# Patient Record
Sex: Female | Born: 1940 | Race: White | Hispanic: No | Marital: Married | Smoking: Never smoker
Health system: Southern US, Community
[De-identification: ages and names within clinical notes are randomized; demographics above are authoritative.]

## PROBLEM LIST (undated history)

## (undated) DIAGNOSIS — R011 Cardiac murmur, unspecified: Secondary | ICD-10-CM

## (undated) DIAGNOSIS — K219 Gastro-esophageal reflux disease without esophagitis: Secondary | ICD-10-CM

## (undated) DIAGNOSIS — G2581 Restless legs syndrome: Secondary | ICD-10-CM

## (undated) DIAGNOSIS — Z974 Presence of external hearing-aid: Secondary | ICD-10-CM

## (undated) DIAGNOSIS — F419 Anxiety disorder, unspecified: Secondary | ICD-10-CM

## (undated) DIAGNOSIS — I10 Essential (primary) hypertension: Secondary | ICD-10-CM

## (undated) DIAGNOSIS — E785 Hyperlipidemia, unspecified: Secondary | ICD-10-CM

## (undated) DIAGNOSIS — M1990 Unspecified osteoarthritis, unspecified site: Secondary | ICD-10-CM

## (undated) HISTORY — PX: ORIF ANKLE FRACTURE: SUR919

## (undated) HISTORY — PX: OOPHORECTOMY: SHX86

## (undated) HISTORY — PX: APPENDECTOMY: SHX54

## (undated) HISTORY — PX: ABDOMINAL HYSTERECTOMY: SHX81

---

## 2006-01-26 ENCOUNTER — Ambulatory Visit: Payer: Self-pay | Admitting: Unknown Physician Specialty

## 2006-04-09 ENCOUNTER — Ambulatory Visit: Payer: Self-pay | Admitting: Gastroenterology

## 2006-06-12 ENCOUNTER — Ambulatory Visit: Payer: Self-pay | Admitting: Otolaryngology

## 2010-03-17 HISTORY — PX: KNEE ARTHROSCOPY: SUR90

## 2011-02-03 ENCOUNTER — Ambulatory Visit: Payer: Self-pay | Admitting: Physician Assistant

## 2011-02-13 ENCOUNTER — Ambulatory Visit: Payer: Self-pay | Admitting: Orthopedic Surgery

## 2011-02-13 DIAGNOSIS — Z01810 Encounter for preprocedural cardiovascular examination: Secondary | ICD-10-CM

## 2011-02-18 ENCOUNTER — Ambulatory Visit: Payer: Self-pay | Admitting: Orthopedic Surgery

## 2011-11-03 ENCOUNTER — Ambulatory Visit: Payer: Self-pay | Admitting: Internal Medicine

## 2013-03-29 ENCOUNTER — Ambulatory Visit: Payer: Self-pay | Admitting: Internal Medicine

## 2014-06-02 ENCOUNTER — Ambulatory Visit: Payer: Self-pay | Admitting: Internal Medicine

## 2015-10-08 ENCOUNTER — Other Ambulatory Visit: Payer: Self-pay | Admitting: Internal Medicine

## 2015-10-08 DIAGNOSIS — Z1231 Encounter for screening mammogram for malignant neoplasm of breast: Secondary | ICD-10-CM

## 2015-10-23 ENCOUNTER — Other Ambulatory Visit: Payer: Self-pay | Admitting: Internal Medicine

## 2015-10-23 ENCOUNTER — Ambulatory Visit
Admission: RE | Admit: 2015-10-23 | Discharge: 2015-10-23 | Disposition: A | Discharge status: Home / Self Care | Payer: Medicare PPO | Source: Ambulatory Visit | Attending: Internal Medicine | Admitting: Internal Medicine

## 2015-10-23 DIAGNOSIS — Z1231 Encounter for screening mammogram for malignant neoplasm of breast: Secondary | ICD-10-CM | POA: Insufficient documentation

## 2016-12-01 ENCOUNTER — Other Ambulatory Visit: Payer: Self-pay | Admitting: Internal Medicine

## 2016-12-01 DIAGNOSIS — Z1231 Encounter for screening mammogram for malignant neoplasm of breast: Secondary | ICD-10-CM

## 2016-12-09 ENCOUNTER — Ambulatory Visit
Admission: RE | Admit: 2016-12-09 | Discharge: 2016-12-09 | Disposition: A | Discharge status: Home / Self Care | Payer: Medicare PPO | Source: Ambulatory Visit | Attending: Internal Medicine | Admitting: Internal Medicine

## 2016-12-09 DIAGNOSIS — Z1231 Encounter for screening mammogram for malignant neoplasm of breast: Secondary | ICD-10-CM | POA: Diagnosis present

## 2018-01-04 ENCOUNTER — Other Ambulatory Visit: Payer: Self-pay | Admitting: Internal Medicine

## 2018-01-04 DIAGNOSIS — Z1231 Encounter for screening mammogram for malignant neoplasm of breast: Secondary | ICD-10-CM

## 2018-01-28 ENCOUNTER — Ambulatory Visit
Admission: RE | Admit: 2018-01-28 | Discharge: 2018-01-28 | Disposition: A | Discharge status: Home / Self Care | Payer: Medicare PPO | Source: Ambulatory Visit | Attending: Internal Medicine | Admitting: Internal Medicine

## 2018-01-28 DIAGNOSIS — Z1231 Encounter for screening mammogram for malignant neoplasm of breast: Secondary | ICD-10-CM | POA: Diagnosis present

## 2019-08-26 ENCOUNTER — Other Ambulatory Visit: Payer: Self-pay | Admitting: Internal Medicine

## 2019-08-26 DIAGNOSIS — Z1231 Encounter for screening mammogram for malignant neoplasm of breast: Secondary | ICD-10-CM

## 2019-09-08 ENCOUNTER — Ambulatory Visit
Admission: RE | Admit: 2019-09-08 | Discharge: 2019-09-08 | Disposition: A | Discharge status: Home / Self Care | Payer: Medicare PPO | Source: Ambulatory Visit | Attending: Internal Medicine | Admitting: Internal Medicine

## 2019-09-08 DIAGNOSIS — Z1231 Encounter for screening mammogram for malignant neoplasm of breast: Secondary | ICD-10-CM | POA: Diagnosis not present

## 2019-12-21 ENCOUNTER — Other Ambulatory Visit: Payer: Self-pay

## 2019-12-21 ENCOUNTER — Encounter: Payer: Self-pay | Admitting: Ophthalmology

## 2019-12-26 ENCOUNTER — Other Ambulatory Visit: Payer: Self-pay

## 2019-12-26 ENCOUNTER — Other Ambulatory Visit
Admission: RE | Admit: 2019-12-26 | Discharge: 2019-12-26 | Disposition: A | Discharge status: Home / Self Care | Payer: Medicare PPO | Source: Ambulatory Visit | Attending: Ophthalmology | Admitting: Ophthalmology

## 2019-12-26 DIAGNOSIS — Z20822 Contact with and (suspected) exposure to covid-19: Secondary | ICD-10-CM | POA: Diagnosis not present

## 2019-12-26 DIAGNOSIS — Z01818 Encounter for other preprocedural examination: Secondary | ICD-10-CM | POA: Diagnosis present

## 2019-12-26 LAB — SARS CORONAVIRUS 2 (TAT 6-24 HRS): SARS Coronavirus 2: NEGATIVE

## 2019-12-26 NOTE — Discharge Instructions (Signed)

## 2019-12-27 NOTE — Anesthesia Preprocedure Evaluation (Addendum)
Anesthesia Evaluation  Patient identified by MRN, date of birth, ID band Patient awake    Reviewed: Allergy & Precautions, NPO status , Patient's Chart, lab work & pertinent test results, reviewed documented beta blocker date and time   History of Anesthesia Complications Negative for: history of anesthetic complications  Airway Mallampati: III  TM Distance: >3 FB Neck ROM: Limited    Dental   Pulmonary    breath sounds clear to auscultation       Cardiovascular Exercise Tolerance: Poor hypertension, (-) angina+ DOE (Chronic, stable)   Rhythm:Regular Rate:Normal   HLD   Neuro/Psych PSYCHIATRIC DISORDERS Anxiety Depression  Restless leg syndrome    GI/Hepatic GERD  Controlled,  Endo/Other    Renal/GU      Musculoskeletal  (+) Arthritis ,   Abdominal   Peds  Hematology   Anesthesia Other Findings   Reproductive/Obstetrics                            Anesthesia Physical Anesthesia Plan  ASA: III  Anesthesia Plan: MAC   Post-op Pain Management:    Induction: Intravenous  PONV Risk Score and Plan: 2 and TIVA, Midazolam and Treatment may vary due to age or medical condition  Airway Management Planned: Nasal Cannula  Additional Equipment:   Intra-op Plan:   Post-operative Plan:   Informed Consent: I have reviewed the patients History and Physical, chart, labs and discussed the procedure including the risks, benefits and alternatives for the proposed anesthesia with the patient or authorized representative who has indicated his/her understanding and acceptance.       Plan Discussed with: CRNA and Anesthesiologist  Anesthesia Plan Comments:        Anesthesia Quick Evaluation

## 2019-12-28 ENCOUNTER — Ambulatory Visit: Payer: Medicare PPO | Admitting: Anesthesiology

## 2019-12-28 ENCOUNTER — Encounter: Payer: Self-pay | Admitting: Ophthalmology

## 2019-12-28 ENCOUNTER — Encounter
Admission: RE | Disposition: A | Discharge status: Home / Self Care | Payer: Self-pay | Source: Home / Self Care | Attending: Ophthalmology

## 2019-12-28 ENCOUNTER — Ambulatory Visit
Admission: RE | Admit: 2019-12-28 | Discharge: 2019-12-28 | Disposition: A | Discharge status: Home / Self Care | Payer: Medicare PPO | Attending: Ophthalmology | Admitting: Ophthalmology

## 2019-12-28 ENCOUNTER — Other Ambulatory Visit: Payer: Self-pay

## 2019-12-28 DIAGNOSIS — K219 Gastro-esophageal reflux disease without esophagitis: Secondary | ICD-10-CM | POA: Diagnosis not present

## 2019-12-28 DIAGNOSIS — F32A Depression, unspecified: Secondary | ICD-10-CM | POA: Insufficient documentation

## 2019-12-28 DIAGNOSIS — H5703 Miosis: Secondary | ICD-10-CM | POA: Insufficient documentation

## 2019-12-28 DIAGNOSIS — E7800 Pure hypercholesterolemia, unspecified: Secondary | ICD-10-CM | POA: Insufficient documentation

## 2019-12-28 DIAGNOSIS — M810 Age-related osteoporosis without current pathological fracture: Secondary | ICD-10-CM | POA: Insufficient documentation

## 2019-12-28 DIAGNOSIS — H2512 Age-related nuclear cataract, left eye: Secondary | ICD-10-CM | POA: Insufficient documentation

## 2019-12-28 DIAGNOSIS — I10 Essential (primary) hypertension: Secondary | ICD-10-CM | POA: Insufficient documentation

## 2019-12-28 DIAGNOSIS — Z79899 Other long term (current) drug therapy: Secondary | ICD-10-CM | POA: Diagnosis not present

## 2019-12-28 DIAGNOSIS — E785 Hyperlipidemia, unspecified: Secondary | ICD-10-CM | POA: Diagnosis not present

## 2019-12-28 DIAGNOSIS — H9190 Unspecified hearing loss, unspecified ear: Secondary | ICD-10-CM | POA: Insufficient documentation

## 2019-12-28 DIAGNOSIS — M1990 Unspecified osteoarthritis, unspecified site: Secondary | ICD-10-CM | POA: Insufficient documentation

## 2019-12-28 HISTORY — DX: Restless legs syndrome: G25.81

## 2019-12-28 HISTORY — DX: Essential (primary) hypertension: I10

## 2019-12-28 HISTORY — DX: Unspecified osteoarthritis, unspecified site: M19.90

## 2019-12-28 HISTORY — DX: Hyperlipidemia, unspecified: E78.5

## 2019-12-28 HISTORY — DX: Anxiety disorder, unspecified: F41.9

## 2019-12-28 HISTORY — DX: Cardiac murmur, unspecified: R01.1

## 2019-12-28 HISTORY — DX: Gastro-esophageal reflux disease without esophagitis: K21.9

## 2019-12-28 HISTORY — DX: Presence of external hearing-aid: Z97.4

## 2019-12-28 HISTORY — PX: CATARACT EXTRACTION W/PHACO: SHX586

## 2019-12-28 SURGERY — PHACOEMULSIFICATION, CATARACT, WITH IOL INSERTION
Anesthesia: Monitor Anesthesia Care | Site: Eye | Laterality: Left

## 2019-12-28 MED ORDER — ARMC OPHTHALMIC DILATING DROPS
1.0000 "application " | OPHTHALMIC | Status: DC | PRN
  Administered 2019-12-28 (×3): 1 via OPHTHALMIC

## 2019-12-28 MED ORDER — ONDANSETRON HCL 4 MG/2ML IJ SOLN: 4.0000 mg | Freq: Once | INTRAMUSCULAR | Status: DC | PRN

## 2019-12-28 MED ORDER — MOXIFLOXACIN HCL 0.5 % OP SOLN
1.0000 [drp] | OPHTHALMIC | Status: DC | PRN
  Administered 2019-12-28 (×3): 1 [drp] via OPHTHALMIC

## 2019-12-28 MED ORDER — ACETAMINOPHEN 10 MG/ML IV SOLN: 1000.0000 mg | Freq: Once | INTRAVENOUS | Status: DC | PRN

## 2019-12-28 MED ORDER — BRIMONIDINE TARTRATE-TIMOLOL 0.2-0.5 % OP SOLN
OPHTHALMIC | Status: DC | PRN
  Administered 2019-12-28: 1 [drp] via OPHTHALMIC

## 2019-12-28 MED ORDER — FENTANYL CITRATE (PF) 100 MCG/2ML IJ SOLN
INTRAMUSCULAR | Status: DC | PRN
  Administered 2019-12-28: 50 ug via INTRAVENOUS

## 2019-12-28 MED ORDER — TETRACAINE HCL 0.5 % OP SOLN
1.0000 [drp] | OPHTHALMIC | Status: DC | PRN
  Administered 2019-12-28 (×3): 1 [drp] via OPHTHALMIC

## 2019-12-28 MED ORDER — MIDAZOLAM HCL 2 MG/2ML IJ SOLN
INTRAMUSCULAR | Status: DC | PRN
  Administered 2019-12-28: 1 mg via INTRAVENOUS

## 2019-12-28 MED ORDER — NA HYALUR & NA CHOND-NA HYALUR 0.4-0.35 ML IO KIT
PACK | INTRAOCULAR | Status: DC | PRN
  Administered 2019-12-28: 1 mL via INTRAOCULAR

## 2019-12-28 MED ORDER — MOXIFLOXACIN HCL 0.5 % OP SOLN
OPHTHALMIC | Status: DC | PRN
  Administered 2019-12-28: 0.2 mL via OPHTHALMIC

## 2019-12-28 MED ORDER — EPINEPHRINE PF 1 MG/ML IJ SOLN
INTRAOCULAR | Status: DC | PRN
  Administered 2019-12-28: 76 mL via OPHTHALMIC

## 2019-12-28 MED ORDER — LIDOCAINE HCL (PF) 2 % IJ SOLN
INTRAOCULAR | Status: DC | PRN
  Administered 2019-12-28: 1 mL

## 2019-12-28 MED ORDER — HYDRALAZINE HCL 20 MG/ML IJ SOLN
5.0000 mg | INTRAMUSCULAR | Status: DC | PRN
  Administered 2019-12-28: 5 mg via INTRAVENOUS

## 2019-12-28 MED ORDER — LACTATED RINGERS IV SOLN: INTRAVENOUS | Status: DC

## 2019-12-28 SURGICAL SUPPLY — 24 items
CANNULA ANT/CHMB 27G (MISCELLANEOUS) ×1 IMPLANT
CANNULA ANT/CHMB 27GA (MISCELLANEOUS) ×3 IMPLANT
GLOVE SURG LX 7.5 STRW (GLOVE) ×2
GLOVE SURG LX STRL 7.5 STRW (GLOVE) ×1 IMPLANT
GLOVE SURG TRIUMPH 8.0 PF LTX (GLOVE) ×3 IMPLANT
GOWN STRL REUS W/ TWL LRG LVL3 (GOWN DISPOSABLE) ×2 IMPLANT
GOWN STRL REUS W/TWL LRG LVL3 (GOWN DISPOSABLE) ×6
LENS IOL DIOP 23.0 (Intraocular Lens) ×3 IMPLANT
LENS IOL TECNIS MONO 23.0 (Intraocular Lens) IMPLANT
MARKER SKIN DUAL TIP RULER LAB (MISCELLANEOUS) ×3 IMPLANT
NDL CAPSULORHEX 25GA (NEEDLE) ×1 IMPLANT
NDL FILTER BLUNT 18X1 1/2 (NEEDLE) ×2 IMPLANT
NEEDLE CAPSULORHEX 25GA (NEEDLE) ×3 IMPLANT
NEEDLE FILTER BLUNT 18X 1/2SAF (NEEDLE) ×4
NEEDLE FILTER BLUNT 18X1 1/2 (NEEDLE) ×2 IMPLANT
PACK CATARACT BRASINGTON (MISCELLANEOUS) ×3 IMPLANT
PACK EYE AFTER SURG (MISCELLANEOUS) ×3 IMPLANT
PACK OPTHALMIC (MISCELLANEOUS) ×3 IMPLANT
RING MALYGIN 7.0 (MISCELLANEOUS) ×2 IMPLANT
SOLUTION OPHTHALMIC SALT (MISCELLANEOUS) ×3 IMPLANT
SYR 3ML LL SCALE MARK (SYRINGE) ×6 IMPLANT
SYR TB 1ML LUER SLIP (SYRINGE) ×3 IMPLANT
WATER STERILE IRR 250ML POUR (IV SOLUTION) ×3 IMPLANT
WIPE NON LINTING 3.25X3.25 (MISCELLANEOUS) ×3 IMPLANT

## 2019-12-28 NOTE — Anesthesia Procedure Notes (Signed)
Date/Time: 12/28/2019 8:56 AM Performed by: Lily Kocher, CRNA Oxygen Delivery Method: Nasal cannula Placement Confirmation: positive ETCO2

## 2019-12-28 NOTE — Anesthesia Postprocedure Evaluation (Signed)
Anesthesia Post Note  Patient: Rachel Foley  Procedure(s) Performed: CATARACT EXTRACTION PHACO AND INTRAOCULAR LENS PLACEMENT (IOC) LEFT MALYUGIN 11.66  01:28.3  13.2% (Left Eye)     Patient location during evaluation: PACU Anesthesia Type: MAC Level of consciousness: awake and alert Pain management: pain level controlled Vital Signs Assessment: post-procedure vital signs reviewed and stable Respiratory status: spontaneous breathing, nonlabored ventilation, respiratory function stable and patient connected to nasal cannula oxygen Cardiovascular status: stable and blood pressure returned to baseline Postop Assessment: no apparent nausea or vomiting Anesthetic complications: no   No complications documented.  Blu Mcglaun A  Chriss Mannan

## 2019-12-28 NOTE — H&P (Signed)

## 2019-12-28 NOTE — Op Note (Addendum)
OPERATIVE NOTE  CHE BELOW [id number] 12/28/2019  PREOPERATIVE DIAGNOSIS:   Nuclear sclerotic cataract left eye with miotic pupil      H25.12   POSTOPERATIVE DIAGNOSIS:   Nuclear sclerotic cataract left eye with miotic pupil.     PROCEDURE:  Phacoemulsification with posterior chamber intraocular lens implantation of the left eye which required pupil stretching with the Malyugin pupil expansion device  Ultrasound time: Procedure(s): CATARACT EXTRACTION PHACO AND INTRAOCULAR LENS PLACEMENT (IOC) LEFT MALYUGIN 11.66  01:28.3  13.2% (Left)  LENS:   Implant Name Type Inv. Item Serial No. Manufacturer Lot No. LRB No. Used Action  LENS IOL DIOP 23.0 - [id number] Intraocular Lens LENS IOL DIOP 23.0 [id number]   Left 1 Implanted          SURGEON:  Deirdre Evener, MD   ANESTHESIA: Topical with tetracaine drops and 2% Xylocaine jelly, augmented with 1% preservative-free intracameral lidocaine.   COMPLICATIONS:  None.   DESCRIPTION OF PROCEDURE:  The patient was identified in the holding room and transported to the operating room and placed in the supine position under the operating microscope.  The left eye was identified as the operative eye and it was prepped and draped in the usual sterile ophthalmic fashion.   A 1 millimeter clear-corneal paracentesis was made at the 1:30 position.  The anterior chamber was filled with Viscoat viscoelastic.  0.5 ml of preservative-free 1% lidocaine was injected into the anterior chamber.  A 2.4 millimeter keratome was used to make a near-clear corneal incision at the 10:30 position.  A Malyugin pupil expander was then placed through the main incision and into the anterior chamber of the eye.  The edge of the iris was secured on the lip of the pupil expander and it was released, thereby expanding the pupil to approximately 7 millimeters for completion of the cataract surgery.  Additional Viscoat was placed in the anterior chamber.  A cystotome  and capsulorrhexis forceps were used to make a curvilinear capsulorrhexis.   Balanced salt solution was used to hydrodissect and hydrodelineate the lens nucleus.   Phacoemulsification was used in stop and chop fashion to remove the lens, nucleus and epinucleus.  The remaining cortex was aspirated using the irrigation aspiration handpiece.  Additional Provisc was placed into the eye to distend the capsular bag for lens placement.  A lens was then injected into the capsular bag.  The pupil expanding ring was removed using a Kuglen hook and insertion device. The remaining viscoelastic was aspirated from the capsular bag and the anterior chamber.  The anterior chamber was filled with balanced salt solution to inflate to a physiologic pressure.   Wounds were hydrated with balanced salt solution.  The anterior chamber was inflated to a physiologic pressure with balanced salt solution.  No wound leaks were noted. Cefuroxime 0.1 ml of a 10mg /ml solution was injected into the anterior chamber for a dose of 1 mg of intracameral antibiotic at the completion of the case.   Timolol and Brimonidine drops were applied to the eye.  The patient was taken to the recovery room in stable condition without complications of anesthesia or surgery.  Abdirahman Chittum 12/28/2019, 10:57 AM

## 2019-12-28 NOTE — Transfer of Care (Signed)
Immediate Anesthesia Transfer of Care Note  Patient: Rachel Foley  Procedure(s) Performed: CATARACT EXTRACTION PHACO AND INTRAOCULAR LENS PLACEMENT (IOC) LEFT MALYUGIN 11.66  01:28.3  13.2% (Left Eye)  Patient Location: PACU  Anesthesia Type: MAC  Level of Consciousness: awake, alert  and patient cooperative  Airway and Oxygen Therapy: Patient Spontanous Breathing and Patient connected to supplemental oxygen  Post-op Assessment: Post-op Vital signs reviewed, Patient's Cardiovascular Status Stable, Respiratory Function Stable, Patent Airway and No signs of Nausea or vomiting  Post-op Vital Signs: Reviewed and stable  Complications: No complications documented.

## 2019-12-29 ENCOUNTER — Encounter: Payer: Self-pay | Admitting: Ophthalmology

## 2020-01-11 ENCOUNTER — Encounter: Payer: Self-pay | Admitting: Ophthalmology

## 2020-01-11 ENCOUNTER — Other Ambulatory Visit: Payer: Self-pay

## 2020-01-16 ENCOUNTER — Other Ambulatory Visit: Payer: Self-pay

## 2020-01-16 ENCOUNTER — Other Ambulatory Visit
Admission: RE | Admit: 2020-01-16 | Discharge: 2020-01-16 | Disposition: A | Discharge status: Home / Self Care | Payer: Medicare PPO | Source: Ambulatory Visit | Attending: Ophthalmology | Admitting: Ophthalmology

## 2020-01-16 DIAGNOSIS — Z20822 Contact with and (suspected) exposure to covid-19: Secondary | ICD-10-CM | POA: Diagnosis not present

## 2020-01-16 DIAGNOSIS — Z01812 Encounter for preprocedural laboratory examination: Secondary | ICD-10-CM | POA: Diagnosis present

## 2020-01-16 LAB — SARS CORONAVIRUS 2 (TAT 6-24 HRS): SARS Coronavirus 2: NEGATIVE

## 2020-01-17 NOTE — Discharge Instructions (Signed)

## 2020-01-18 ENCOUNTER — Ambulatory Visit
Admission: RE | Admit: 2020-01-18 | Discharge: 2020-01-18 | Disposition: A | Discharge status: Home / Self Care | Payer: Medicare PPO | Attending: Ophthalmology | Admitting: Ophthalmology

## 2020-01-18 ENCOUNTER — Ambulatory Visit: Payer: Medicare PPO | Admitting: Anesthesiology

## 2020-01-18 ENCOUNTER — Encounter
Admission: RE | Disposition: A | Discharge status: Home / Self Care | Payer: Self-pay | Source: Home / Self Care | Attending: Ophthalmology

## 2020-01-18 ENCOUNTER — Other Ambulatory Visit: Payer: Self-pay

## 2020-01-18 ENCOUNTER — Encounter: Payer: Self-pay | Admitting: Ophthalmology

## 2020-01-18 DIAGNOSIS — H5703 Miosis: Secondary | ICD-10-CM | POA: Insufficient documentation

## 2020-01-18 DIAGNOSIS — H2511 Age-related nuclear cataract, right eye: Secondary | ICD-10-CM | POA: Insufficient documentation

## 2020-01-18 HISTORY — PX: CATARACT EXTRACTION W/PHACO: SHX586

## 2020-01-18 SURGERY — PHACOEMULSIFICATION, CATARACT, WITH IOL INSERTION
Anesthesia: Monitor Anesthesia Care | Site: Eye | Laterality: Right

## 2020-01-18 MED ORDER — EPINEPHRINE PF 1 MG/ML IJ SOLN
INTRAOCULAR | Status: DC | PRN
  Administered 2020-01-18: 80 mL via OPHTHALMIC

## 2020-01-18 MED ORDER — NA HYALUR & NA CHOND-NA HYALUR 0.4-0.35 ML IO KIT
PACK | INTRAOCULAR | Status: DC | PRN
  Administered 2020-01-18: 1 mL via INTRAOCULAR

## 2020-01-18 MED ORDER — LIDOCAINE HCL (PF) 2 % IJ SOLN
INTRAOCULAR | Status: DC | PRN
  Administered 2020-01-18: 1 mL

## 2020-01-18 MED ORDER — BRIMONIDINE TARTRATE-TIMOLOL 0.2-0.5 % OP SOLN
OPHTHALMIC | Status: DC | PRN
  Administered 2020-01-18: 1 [drp] via OPHTHALMIC

## 2020-01-18 MED ORDER — MOXIFLOXACIN HCL 0.5 % OP SOLN
OPHTHALMIC | Status: DC | PRN
  Administered 2020-01-18: 0.2 mL via OPHTHALMIC

## 2020-01-18 MED ORDER — MOXIFLOXACIN HCL 0.5 % OP SOLN
1.0000 [drp] | OPHTHALMIC | Status: DC | PRN
  Administered 2020-01-18 (×3): 1 [drp] via OPHTHALMIC

## 2020-01-18 MED ORDER — OXYCODONE HCL 5 MG/5ML PO SOLN: 5.0000 mg | Freq: Once | ORAL | Status: DC | PRN

## 2020-01-18 MED ORDER — LACTATED RINGERS IV SOLN: INTRAVENOUS | Status: DC

## 2020-01-18 MED ORDER — NA CHONDROIT SULF-NA HYALURON 40-30 MG/ML IO SOSY
INTRAOCULAR | Status: DC | PRN
  Administered 2020-01-18: 0.5 mL via INTRAOCULAR

## 2020-01-18 MED ORDER — TETRACAINE HCL 0.5 % OP SOLN
1.0000 [drp] | OPHTHALMIC | Status: DC | PRN
  Administered 2020-01-18 (×3): 1 [drp] via OPHTHALMIC

## 2020-01-18 MED ORDER — OXYCODONE HCL 5 MG PO TABS: 5.0000 mg | ORAL_TABLET | Freq: Once | ORAL | Status: DC | PRN

## 2020-01-18 MED ORDER — ARMC OPHTHALMIC DILATING DROPS
1.0000 "application " | OPHTHALMIC | Status: DC | PRN
  Administered 2020-01-18 (×3): 1 via OPHTHALMIC

## 2020-01-18 MED ORDER — MIDAZOLAM HCL 2 MG/2ML IJ SOLN
INTRAMUSCULAR | Status: DC | PRN
  Administered 2020-01-18: 1 mg via INTRAVENOUS

## 2020-01-18 MED ORDER — FENTANYL CITRATE (PF) 100 MCG/2ML IJ SOLN
INTRAMUSCULAR | Status: DC | PRN
  Administered 2020-01-18: 50 ug via INTRAVENOUS

## 2020-01-18 SURGICAL SUPPLY — 24 items
CANNULA ANT/CHMB 27G (MISCELLANEOUS) ×1 IMPLANT
CANNULA ANT/CHMB 27GA (MISCELLANEOUS) ×2 IMPLANT
GLOVE SURG LX 7.5 STRW (GLOVE) ×1
GLOVE SURG LX STRL 7.5 STRW (GLOVE) ×1 IMPLANT
GLOVE SURG TRIUMPH 8.0 PF LTX (GLOVE) ×2 IMPLANT
GOWN STRL REUS W/ TWL LRG LVL3 (GOWN DISPOSABLE) ×2 IMPLANT
GOWN STRL REUS W/TWL LRG LVL3 (GOWN DISPOSABLE) ×4
LENS IOL DIOP 23.0 (Intraocular Lens) ×2 IMPLANT
LENS IOL TECNIS MONO 23.0 (Intraocular Lens) IMPLANT
MARKER SKIN DUAL TIP RULER LAB (MISCELLANEOUS) ×2 IMPLANT
NDL CAPSULORHEX 25GA (NEEDLE) ×1 IMPLANT
NDL FILTER BLUNT 18X1 1/2 (NEEDLE) ×2 IMPLANT
NEEDLE CAPSULORHEX 25GA (NEEDLE) ×2 IMPLANT
NEEDLE FILTER BLUNT 18X 1/2SAF (NEEDLE) ×2
NEEDLE FILTER BLUNT 18X1 1/2 (NEEDLE) ×2 IMPLANT
PACK CATARACT BRASINGTON (MISCELLANEOUS) ×2 IMPLANT
PACK EYE AFTER SURG (MISCELLANEOUS) ×2 IMPLANT
PACK OPTHALMIC (MISCELLANEOUS) ×2 IMPLANT
RING MALYGIN 7.0 (MISCELLANEOUS) ×1 IMPLANT
SOLUTION OPHTHALMIC SALT (MISCELLANEOUS) ×2 IMPLANT
SYR 3ML LL SCALE MARK (SYRINGE) ×4 IMPLANT
SYR TB 1ML LUER SLIP (SYRINGE) ×2 IMPLANT
WATER STERILE IRR 250ML POUR (IV SOLUTION) ×2 IMPLANT
WIPE NON LINTING 3.25X3.25 (MISCELLANEOUS) ×2 IMPLANT

## 2020-01-18 NOTE — Op Note (Signed)
OPERATIVE NOTE  [name] [id number] 01/18/2020   PREOPERATIVE DIAGNOSIS:    Nuclear Sclerotic Cataract Right eye with miotic pupil.        H25.11  POSTOPERATIVE DIAGNOSIS: Nuclear Sclerotic Cataract Right eye with miotic pupil.          PROCEDURE:  Phacoemusification with posterior chamber intraocular lens placement of the right eye which required pupil stretching with the Malyugin pupil expansion device. Ultrasound time: Procedure(s): CATARACT EXTRACTION PHACO AND INTRAOCULAR LENS PLACEMENT (IOC) RIGHT MALYUGIN 11.41 01:23.4 13.7% (Right)  LENS:   Implant Name Type Inv. Item Serial No. Manufacturer Lot No. LRB No. Used Action  LENS IOL DIOP 23.0 - [id number] Intraocular Lens LENS IOL DIOP 23.0 [id number] JOHNSON   Right 1 Implanted      SURGEON:  Deirdre Evener, MD   ANESTHESIA:  Topical with tetracaine drops and 2% Xylocaine jelly, augmented with 1% preservative-free intracameral lidocaine.   COMPLICATIONS:  None.   DESCRIPTION OF PROCEDURE:  The patient was identified in the holding room and transported to the operating room and placed in the supine position under the operating microscope. Theright eye was identified as the operative eye and it was prepped and draped in the usual sterile ophthalmic fashion.   A 1 millimeter clear-corneal paracentesis was made at the 12:00 position.  0.5 ml of preservative-free 1% lidocaine was injected into the anterior chamber. The anterior chamber was filled with Viscoat viscoelastic.  A 2.4 millimeter keratome was used to make a near-clear corneal incision at the 9:00 position. A Malyugin pupil expander was then placed through the main incision and into the anterior chamber of the eye.  The edge of the iris was secured on the lip of the pupil expander and it was released, thereby expanding the pupil to approximately 7 millimeters for completion of the cataract surgery.  Additional Viscoat was placed in the anterior chamber.  A cystotome  and capsulorrhexis forceps were used to make a curvilinear capsulorrhexis.   Balanced salt solution was used to hydrodissect and hydrodelineate the lens nucleus.   Phacoemulsification was used in stop and chop fashion to remove the lens, nucleus and epinucleus. 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  The remaining cortex was aspirated using the irrigation aspiration handpiece.  Additional Provisc was placed into the eye to distend the capsular bag for lens placement.  A lens was then injected into the capsular bag.  The pupil expanding ring was removed using a Kuglen hook and insertion device. The remaining viscoelastic was aspirated from the capsular bag and the anterior chamber.  The anterior chamber was filled with balanced salt solution to inflate to a physiologic pressure.  Wounds were hydrated with balanced salt solution.  The anterior chamber was inflated to a physiologic pressure with balanced salt solution.  No wound leaks were noted.Vigamox 0.2 ml of a 1mg  per ml solution was injected into the anterior chamber for a dose of 0.2 mg of intracameral antibiotic at the completion of the case. Timolol and Brimonidine drops were applied to the eye.  The patient was taken to the recovery room in stable condition without complications of anesthesia or surgery.  [name] 01/18/2020, 10:01 AM

## 2020-01-18 NOTE — Anesthesia Preprocedure Evaluation (Signed)
Anesthesia Evaluation  Patient identified by MRN, date of birth, ID band Patient awake    Reviewed: Allergy & Precautions, H&P , NPO status , Patient's Chart, lab work & pertinent test results  History of Anesthesia Complications Negative for: history of anesthetic complications  Airway Mallampati: II  TM Distance: >3 FB Neck ROM: full    Dental no notable dental hx.    Pulmonary neg pulmonary ROS,    Pulmonary exam normal        Cardiovascular hypertension, On Medications Normal cardiovascular exam Rhythm:regular Rate:Normal     Neuro/Psych Anxiety negative neurological ROS     GI/Hepatic Neg liver ROS, Medicated,  Endo/Other  negative endocrine ROS  Renal/GU negative Renal ROS  negative genitourinary   Musculoskeletal   Abdominal   Peds  Hematology negative hematology ROS (+)   Anesthesia Other Findings   Reproductive/Obstetrics                             Anesthesia Physical Anesthesia Plan  ASA: II  Anesthesia Plan: MAC   Post-op Pain Management:    Induction:   PONV Risk Score and Plan: 2 and Treatment may vary due to age or medical condition  Airway Management Planned:   Additional Equipment:   Intra-op Plan:   Post-operative Plan:   Informed Consent: I have reviewed the patients History and Physical, chart, labs and discussed the procedure including the risks, benefits and alternatives for the proposed anesthesia with the patient or authorized representative who has indicated his/her understanding and acceptance.       Plan Discussed with:   Anesthesia Plan Comments:         Anesthesia Quick Evaluation

## 2020-01-18 NOTE — Anesthesia Postprocedure Evaluation (Signed)
Anesthesia Post Note  Patient: Rachel Foley  Procedure(s) Performed: CATARACT EXTRACTION PHACO AND INTRAOCULAR LENS PLACEMENT (IOC) RIGHT MALYUGIN 11.41 01:23.4 13.7% (Right Eye)     Patient location during evaluation: PACU Anesthesia Type: MAC Level of consciousness: awake and alert Pain management: pain level controlled Vital Signs Assessment: post-procedure vital signs reviewed and stable Respiratory status: spontaneous breathing Cardiovascular status: stable Anesthetic complications: no   No complications documented.  Gillian Scarce

## 2020-01-18 NOTE — Transfer of Care (Signed)
Immediate Anesthesia Transfer of Care Note  Patient: Rachel Foley  Procedure(s) Performed: CATARACT EXTRACTION PHACO AND INTRAOCULAR LENS PLACEMENT (IOC) RIGHT MALYUGIN 11.41 01:23.4 13.7% (Right Eye)  Patient Location: PACU  Anesthesia Type: MAC  Level of Consciousness: awake, alert  and patient cooperative  Airway and Oxygen Therapy: Patient Spontanous Breathing and Patient connected to supplemental oxygen  Post-op Assessment: Post-op Vital signs reviewed, Patient's Cardiovascular Status Stable, Respiratory Function Stable, Patent Airway and No signs of Nausea or vomiting  Post-op Vital Signs: Reviewed and stable  Complications: No complications documented.

## 2020-01-18 NOTE — H&P (Signed)

## 2020-01-18 NOTE — Anesthesia Procedure Notes (Signed)
Procedure Name: MAC Date/Time: 01/18/2020 9:27 AM Performed by: Cameron Ali, CRNA Pre-anesthesia Checklist: Patient identified, Emergency Drugs available, Suction available, Timeout performed and Patient being monitored Patient Re-evaluated:Patient Re-evaluated prior to induction Oxygen Delivery Method: Nasal cannula Placement Confirmation: positive ETCO2

## 2020-01-19 ENCOUNTER — Encounter: Payer: Self-pay | Admitting: Ophthalmology

## 2020-09-12 ENCOUNTER — Other Ambulatory Visit: Payer: Self-pay | Admitting: Internal Medicine

## 2020-10-25 ENCOUNTER — Other Ambulatory Visit: Payer: Self-pay | Admitting: Internal Medicine

## 2020-10-25 DIAGNOSIS — Z1231 Encounter for screening mammogram for malignant neoplasm of breast: Secondary | ICD-10-CM

## 2021-01-22 ENCOUNTER — Ambulatory Visit
Admission: RE | Admit: 2021-01-22 | Discharge: 2021-01-22 | Disposition: A | Discharge status: Home / Self Care | Payer: Medicare PPO | Source: Ambulatory Visit | Attending: Internal Medicine | Admitting: Internal Medicine

## 2021-01-22 ENCOUNTER — Other Ambulatory Visit: Payer: Self-pay

## 2021-01-22 DIAGNOSIS — Z1231 Encounter for screening mammogram for malignant neoplasm of breast: Secondary | ICD-10-CM | POA: Insufficient documentation

## 2022-03-26 ENCOUNTER — Other Ambulatory Visit: Payer: Self-pay | Admitting: Internal Medicine

## 2022-03-26 DIAGNOSIS — Z1231 Encounter for screening mammogram for malignant neoplasm of breast: Secondary | ICD-10-CM

## 2022-04-15 ENCOUNTER — Ambulatory Visit
Admission: RE | Admit: 2022-04-15 | Discharge: 2022-04-15 | Disposition: A | Discharge status: Home / Self Care | Payer: Medicare HMO | Source: Ambulatory Visit | Attending: Internal Medicine | Admitting: Internal Medicine

## 2022-04-15 DIAGNOSIS — Z1231 Encounter for screening mammogram for malignant neoplasm of breast: Secondary | ICD-10-CM | POA: Diagnosis not present

## 2022-04-29 DIAGNOSIS — Z0000 Encounter for general adult medical examination without abnormal findings: Secondary | ICD-10-CM | POA: Diagnosis not present

## 2022-04-29 DIAGNOSIS — R7309 Other abnormal glucose: Secondary | ICD-10-CM | POA: Diagnosis not present

## 2022-04-29 DIAGNOSIS — I10 Essential (primary) hypertension: Secondary | ICD-10-CM | POA: Diagnosis not present

## 2022-04-29 DIAGNOSIS — M810 Age-related osteoporosis without current pathological fracture: Secondary | ICD-10-CM | POA: Diagnosis not present

## 2022-04-29 DIAGNOSIS — F325 Major depressive disorder, single episode, in full remission: Secondary | ICD-10-CM | POA: Diagnosis not present

## 2022-04-29 DIAGNOSIS — M545 Low back pain, unspecified: Secondary | ICD-10-CM | POA: Diagnosis not present

## 2022-05-02 DIAGNOSIS — H18451 Nodular corneal degeneration, right eye: Secondary | ICD-10-CM | POA: Diagnosis not present

## 2022-05-02 DIAGNOSIS — H04201 Unspecified epiphora, right lacrimal gland: Secondary | ICD-10-CM | POA: Diagnosis not present

## 2022-05-06 DIAGNOSIS — M810 Age-related osteoporosis without current pathological fracture: Secondary | ICD-10-CM | POA: Diagnosis not present

## 2022-05-06 DIAGNOSIS — Z0000 Encounter for general adult medical examination without abnormal findings: Secondary | ICD-10-CM | POA: Diagnosis not present

## 2022-05-06 DIAGNOSIS — I10 Essential (primary) hypertension: Secondary | ICD-10-CM | POA: Diagnosis not present

## 2022-05-06 DIAGNOSIS — F32A Depression, unspecified: Secondary | ICD-10-CM | POA: Diagnosis not present

## 2022-05-06 DIAGNOSIS — M1711 Unilateral primary osteoarthritis, right knee: Secondary | ICD-10-CM | POA: Diagnosis not present

## 2022-05-06 DIAGNOSIS — Z23 Encounter for immunization: Secondary | ICD-10-CM | POA: Diagnosis not present

## 2022-05-06 DIAGNOSIS — Z1331 Encounter for screening for depression: Secondary | ICD-10-CM | POA: Diagnosis not present

## 2022-05-06 DIAGNOSIS — F419 Anxiety disorder, unspecified: Secondary | ICD-10-CM | POA: Diagnosis not present

## 2022-05-28 DIAGNOSIS — M170 Bilateral primary osteoarthritis of knee: Secondary | ICD-10-CM | POA: Diagnosis not present

## 2022-08-18 IMAGING — MG MM DIGITAL SCREENING BILAT W/ TOMO AND CAD
6 of 10 series · 6 of 30 positions shown · non-contrast
Comparison: Previous exam(s).

CLINICAL DATA: Screening.

EXAM:
DIGITAL SCREENING BILATERAL MAMMOGRAM WITH TOMOSYNTHESIS AND CAD
TECHNIQUE: Bilateral screening digital craniocaudal and mediolateral oblique
mammograms were obtained. Bilateral screening digital breast
tomosynthesis was performed. The images were evaluated with
computer-aided detection.

[R MLO synth-2D (1 of 2)]
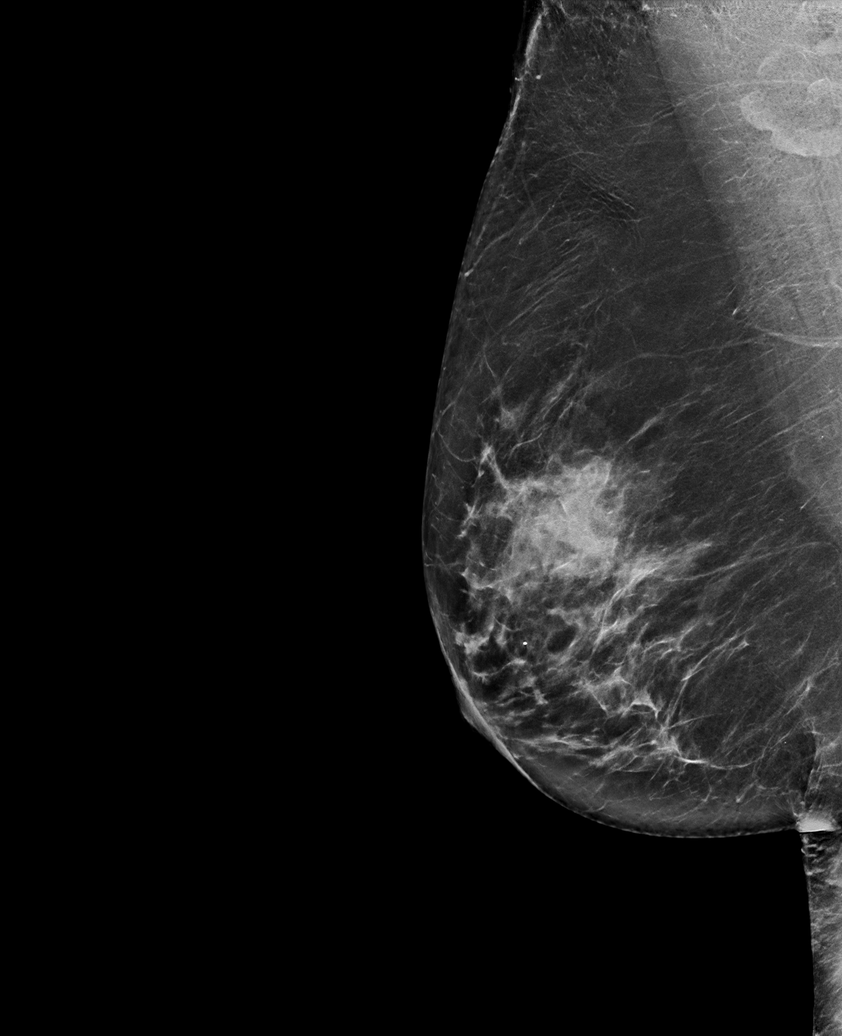

[L CC synth-2D]
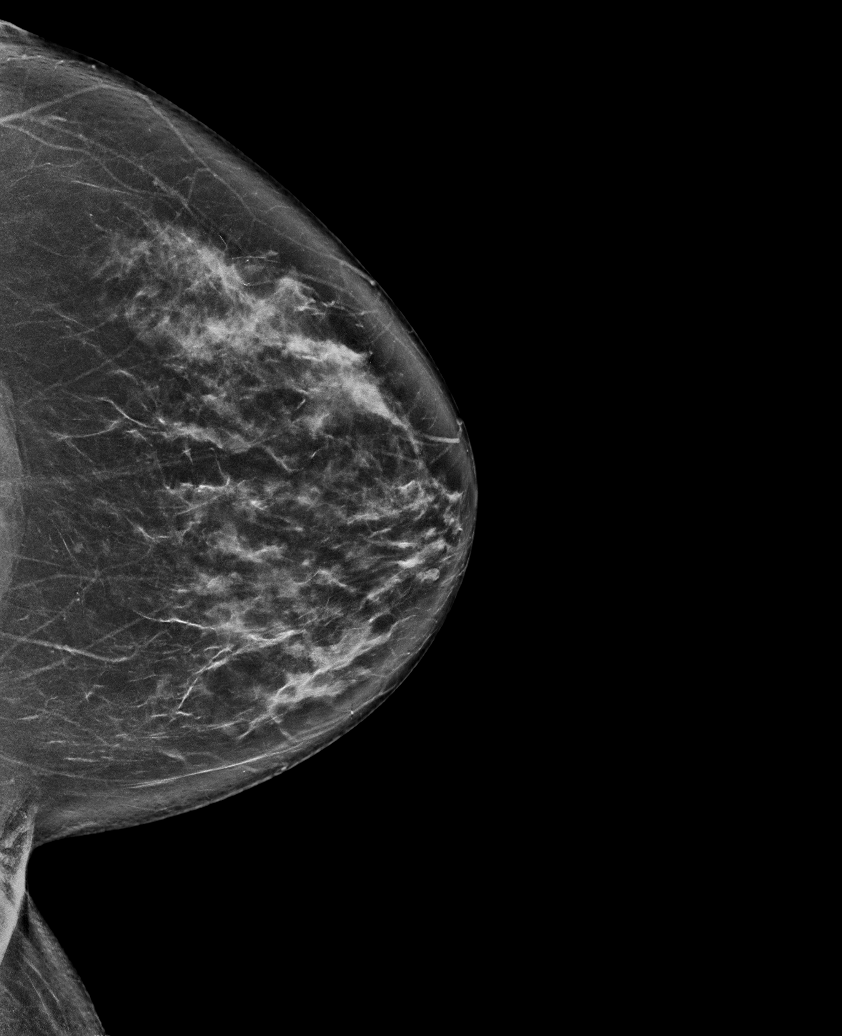

[L MLO synth-2D]
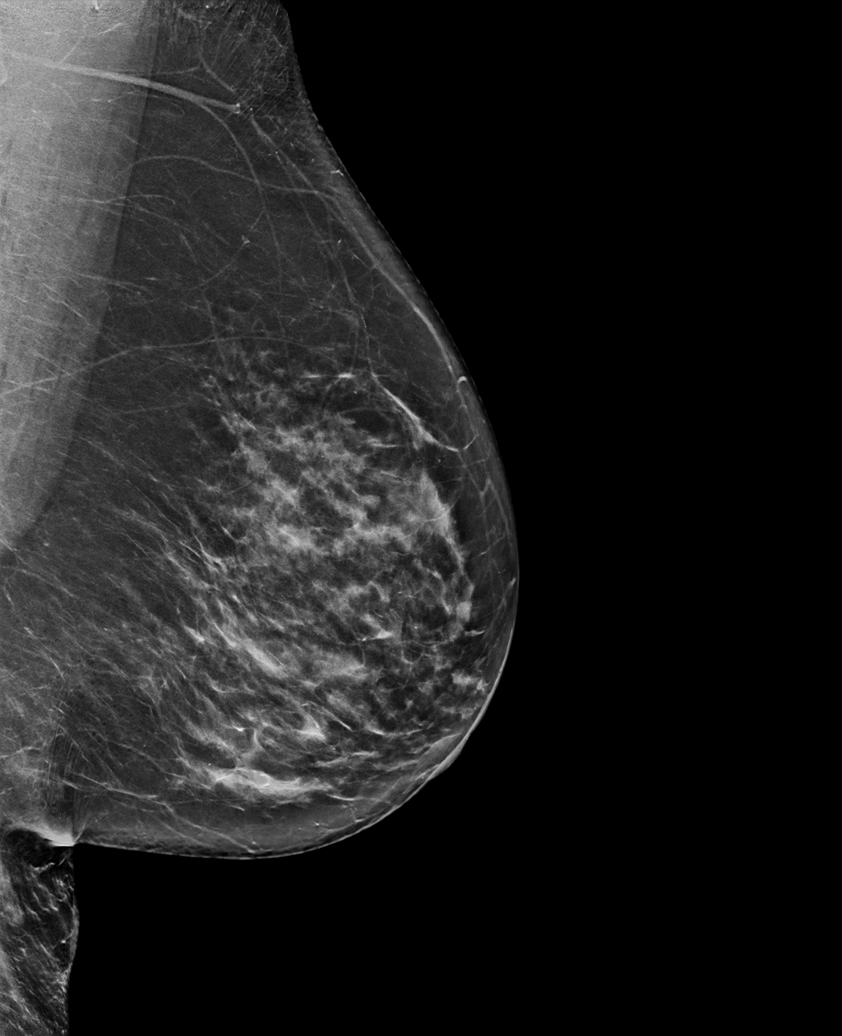

[R MLO synth-2D (2 of 2)]
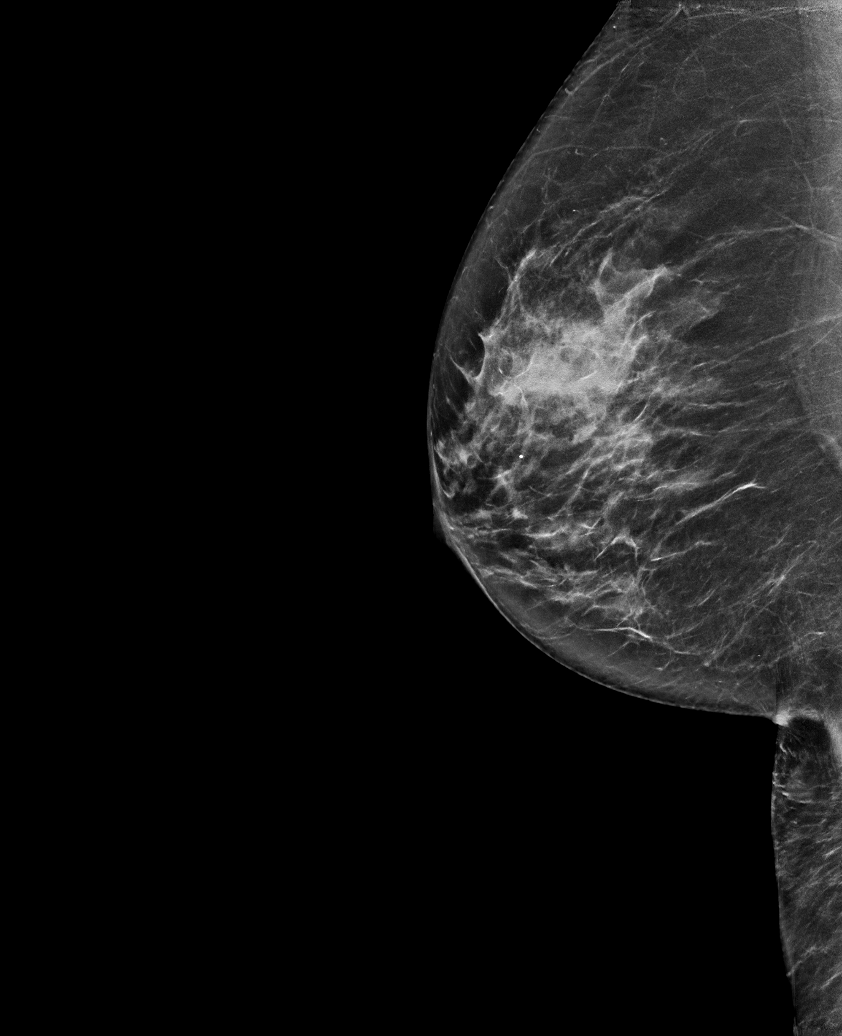

[R CC synth-2D]
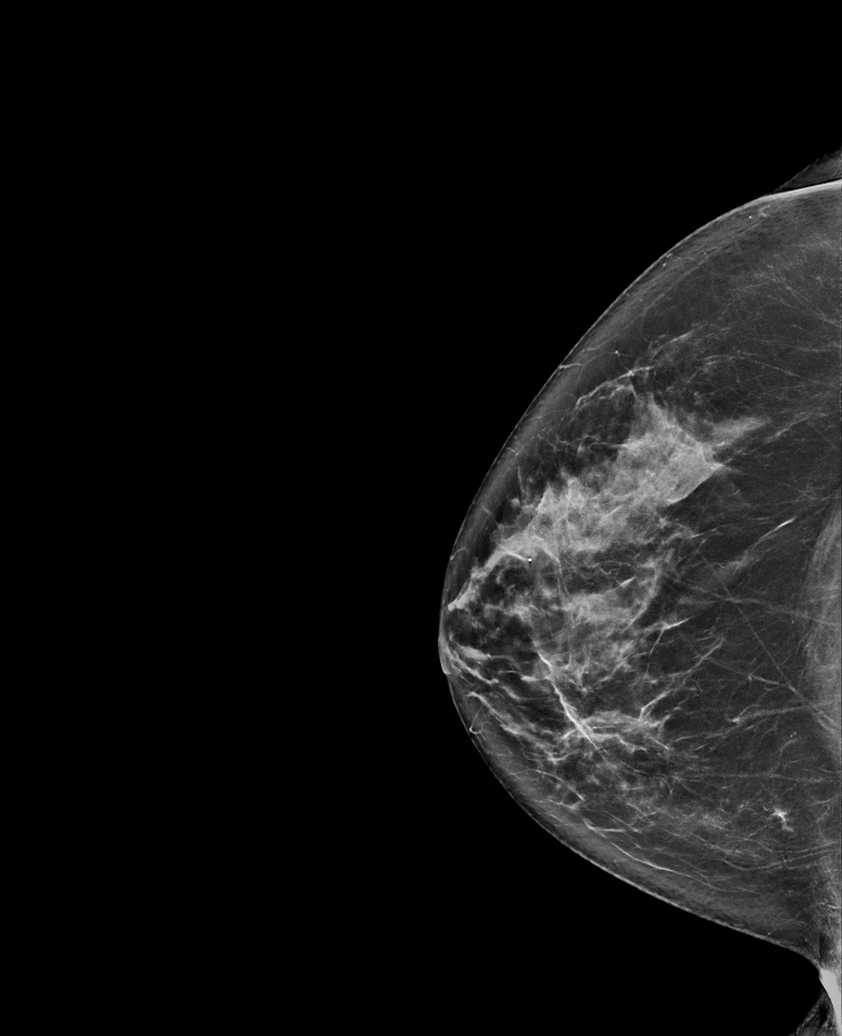

[L MLO tomo · tomo slice 40/79.0]
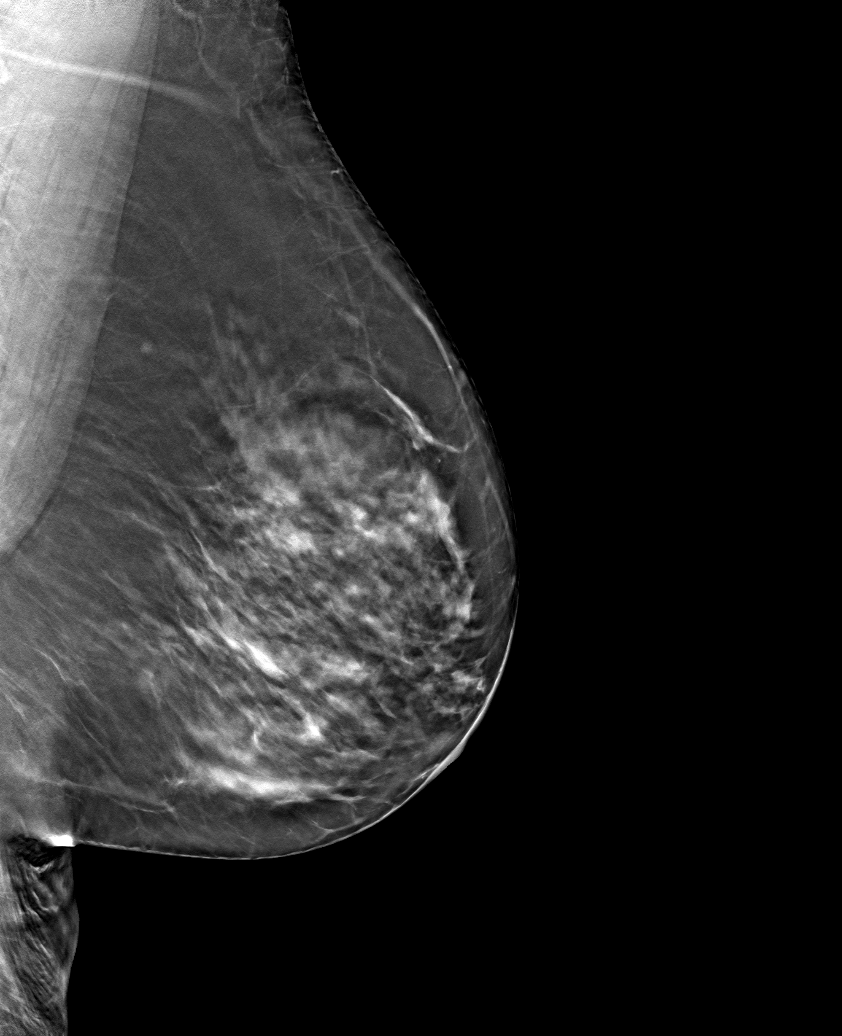

[6 of 30 positions shown; findings below may reference images not displayed]

ACR Breast Density Category c: The breast tissue is heterogeneously
dense, which may obscure small masses.
FINDINGS: There are no findings suspicious for malignancy.
IMPRESSION: No mammographic evidence of malignancy. A result letter of this
screening mammogram will be mailed directly to the patient.

RECOMMENDATION:
Screening mammogram in one year. (Code:[id number])

BI-RADS CATEGORY  1: Negative.

## 2022-10-28 DIAGNOSIS — F325 Major depressive disorder, single episode, in full remission: Secondary | ICD-10-CM | POA: Diagnosis not present

## 2022-10-28 DIAGNOSIS — R7309 Other abnormal glucose: Secondary | ICD-10-CM | POA: Diagnosis not present

## 2022-10-28 DIAGNOSIS — I10 Essential (primary) hypertension: Secondary | ICD-10-CM | POA: Diagnosis not present

## 2022-10-28 DIAGNOSIS — F419 Anxiety disorder, unspecified: Secondary | ICD-10-CM | POA: Diagnosis not present

## 2022-10-28 DIAGNOSIS — M818 Other osteoporosis without current pathological fracture: Secondary | ICD-10-CM | POA: Diagnosis not present

## 2022-10-28 DIAGNOSIS — M1711 Unilateral primary osteoarthritis, right knee: Secondary | ICD-10-CM | POA: Diagnosis not present

## 2022-11-04 DIAGNOSIS — F32A Depression, unspecified: Secondary | ICD-10-CM | POA: Diagnosis not present

## 2022-11-04 DIAGNOSIS — M810 Age-related osteoporosis without current pathological fracture: Secondary | ICD-10-CM | POA: Diagnosis not present

## 2022-11-04 DIAGNOSIS — G2581 Restless legs syndrome: Secondary | ICD-10-CM | POA: Diagnosis not present

## 2022-11-04 DIAGNOSIS — I10 Essential (primary) hypertension: Secondary | ICD-10-CM | POA: Diagnosis not present

## 2022-11-18 DIAGNOSIS — Z961 Presence of intraocular lens: Secondary | ICD-10-CM | POA: Diagnosis not present

## 2022-11-18 DIAGNOSIS — H16223 Keratoconjunctivitis sicca, not specified as Sjogren's, bilateral: Secondary | ICD-10-CM | POA: Diagnosis not present

## 2022-12-09 DIAGNOSIS — R6889 Other general symptoms and signs: Secondary | ICD-10-CM | POA: Diagnosis not present

## 2022-12-09 DIAGNOSIS — Z03818 Encounter for observation for suspected exposure to other biological agents ruled out: Secondary | ICD-10-CM | POA: Diagnosis not present

## 2023-04-24 DIAGNOSIS — I10 Essential (primary) hypertension: Secondary | ICD-10-CM | POA: Diagnosis not present

## 2023-04-24 DIAGNOSIS — F419 Anxiety disorder, unspecified: Secondary | ICD-10-CM | POA: Diagnosis not present

## 2023-04-24 DIAGNOSIS — I4891 Unspecified atrial fibrillation: Secondary | ICD-10-CM | POA: Diagnosis not present

## 2023-04-24 DIAGNOSIS — R011 Cardiac murmur, unspecified: Secondary | ICD-10-CM | POA: Diagnosis not present

## 2023-04-24 DIAGNOSIS — E7800 Pure hypercholesterolemia, unspecified: Secondary | ICD-10-CM | POA: Diagnosis not present

## 2023-04-24 DIAGNOSIS — G4733 Obstructive sleep apnea (adult) (pediatric): Secondary | ICD-10-CM | POA: Diagnosis not present

## 2023-04-24 DIAGNOSIS — R42 Dizziness and giddiness: Secondary | ICD-10-CM | POA: Diagnosis not present

## 2023-04-24 DIAGNOSIS — R079 Chest pain, unspecified: Secondary | ICD-10-CM | POA: Diagnosis not present

## 2023-04-24 DIAGNOSIS — R0602 Shortness of breath: Secondary | ICD-10-CM | POA: Diagnosis not present

## 2023-04-28 ENCOUNTER — Other Ambulatory Visit: Payer: Self-pay | Admitting: Internal Medicine

## 2023-04-28 DIAGNOSIS — E7800 Pure hypercholesterolemia, unspecified: Secondary | ICD-10-CM

## 2023-04-28 DIAGNOSIS — I10 Essential (primary) hypertension: Secondary | ICD-10-CM

## 2023-05-05 DIAGNOSIS — D649 Anemia, unspecified: Secondary | ICD-10-CM | POA: Diagnosis not present

## 2023-05-05 DIAGNOSIS — M810 Age-related osteoporosis without current pathological fracture: Secondary | ICD-10-CM | POA: Diagnosis not present

## 2023-05-05 DIAGNOSIS — G2581 Restless legs syndrome: Secondary | ICD-10-CM | POA: Diagnosis not present

## 2023-05-05 DIAGNOSIS — R7303 Prediabetes: Secondary | ICD-10-CM | POA: Diagnosis not present

## 2023-05-05 DIAGNOSIS — I10 Essential (primary) hypertension: Secondary | ICD-10-CM | POA: Diagnosis not present

## 2023-05-05 DIAGNOSIS — F325 Major depressive disorder, single episode, in full remission: Secondary | ICD-10-CM | POA: Diagnosis not present

## 2023-05-12 ENCOUNTER — Other Ambulatory Visit: Payer: Self-pay | Admitting: Internal Medicine

## 2023-05-12 DIAGNOSIS — F325 Major depressive disorder, single episode, in full remission: Secondary | ICD-10-CM | POA: Diagnosis not present

## 2023-05-12 DIAGNOSIS — G2581 Restless legs syndrome: Secondary | ICD-10-CM | POA: Diagnosis not present

## 2023-05-12 DIAGNOSIS — K219 Gastro-esophageal reflux disease without esophagitis: Secondary | ICD-10-CM | POA: Diagnosis not present

## 2023-05-12 DIAGNOSIS — Z1331 Encounter for screening for depression: Secondary | ICD-10-CM | POA: Diagnosis not present

## 2023-05-12 DIAGNOSIS — Z1231 Encounter for screening mammogram for malignant neoplasm of breast: Secondary | ICD-10-CM

## 2023-05-12 DIAGNOSIS — R7309 Other abnormal glucose: Secondary | ICD-10-CM | POA: Diagnosis not present

## 2023-05-12 DIAGNOSIS — R0602 Shortness of breath: Secondary | ICD-10-CM | POA: Diagnosis not present

## 2023-05-12 DIAGNOSIS — G4733 Obstructive sleep apnea (adult) (pediatric): Secondary | ICD-10-CM | POA: Diagnosis not present

## 2023-05-12 DIAGNOSIS — M810 Age-related osteoporosis without current pathological fracture: Secondary | ICD-10-CM | POA: Diagnosis not present

## 2023-05-12 DIAGNOSIS — Z0000 Encounter for general adult medical examination without abnormal findings: Secondary | ICD-10-CM | POA: Diagnosis not present

## 2023-05-12 DIAGNOSIS — I10 Essential (primary) hypertension: Secondary | ICD-10-CM | POA: Diagnosis not present

## 2023-05-13 ENCOUNTER — Ambulatory Visit
Admission: RE | Admit: 2023-05-13 | Discharge: 2023-05-13 | Disposition: A | Discharge status: Home / Self Care | Payer: Self-pay | Source: Ambulatory Visit | Attending: Internal Medicine | Admitting: Internal Medicine

## 2023-05-13 DIAGNOSIS — I10 Essential (primary) hypertension: Secondary | ICD-10-CM | POA: Insufficient documentation

## 2023-05-13 DIAGNOSIS — E7800 Pure hypercholesterolemia, unspecified: Secondary | ICD-10-CM | POA: Insufficient documentation

## 2023-05-29 ENCOUNTER — Ambulatory Visit
Admission: RE | Admit: 2023-05-29 | Discharge: 2023-05-29 | Disposition: A | Discharge status: Home / Self Care | Source: Ambulatory Visit | Attending: Internal Medicine | Admitting: Internal Medicine

## 2023-05-29 DIAGNOSIS — Z1231 Encounter for screening mammogram for malignant neoplasm of breast: Secondary | ICD-10-CM | POA: Diagnosis not present

## 2023-06-09 DIAGNOSIS — G4733 Obstructive sleep apnea (adult) (pediatric): Secondary | ICD-10-CM | POA: Diagnosis not present

## 2023-07-21 DIAGNOSIS — G4733 Obstructive sleep apnea (adult) (pediatric): Secondary | ICD-10-CM | POA: Diagnosis not present

## 2023-07-21 DIAGNOSIS — F419 Anxiety disorder, unspecified: Secondary | ICD-10-CM | POA: Diagnosis not present

## 2023-07-21 DIAGNOSIS — R42 Dizziness and giddiness: Secondary | ICD-10-CM | POA: Diagnosis not present

## 2023-07-21 DIAGNOSIS — I10 Essential (primary) hypertension: Secondary | ICD-10-CM | POA: Diagnosis not present

## 2023-07-21 DIAGNOSIS — I4891 Unspecified atrial fibrillation: Secondary | ICD-10-CM | POA: Diagnosis not present

## 2023-07-21 DIAGNOSIS — R0602 Shortness of breath: Secondary | ICD-10-CM | POA: Diagnosis not present

## 2023-07-21 DIAGNOSIS — R011 Cardiac murmur, unspecified: Secondary | ICD-10-CM | POA: Diagnosis not present

## 2023-07-21 DIAGNOSIS — E7800 Pure hypercholesterolemia, unspecified: Secondary | ICD-10-CM | POA: Diagnosis not present

## 2023-07-21 DIAGNOSIS — I739 Peripheral vascular disease, unspecified: Secondary | ICD-10-CM | POA: Diagnosis not present

## 2023-07-31 DIAGNOSIS — I739 Peripheral vascular disease, unspecified: Secondary | ICD-10-CM | POA: Diagnosis not present

## 2023-08-05 DIAGNOSIS — G4733 Obstructive sleep apnea (adult) (pediatric): Secondary | ICD-10-CM | POA: Diagnosis not present

## 2023-08-05 DIAGNOSIS — E7800 Pure hypercholesterolemia, unspecified: Secondary | ICD-10-CM | POA: Diagnosis not present

## 2023-08-05 DIAGNOSIS — R011 Cardiac murmur, unspecified: Secondary | ICD-10-CM | POA: Diagnosis not present

## 2023-08-05 DIAGNOSIS — F419 Anxiety disorder, unspecified: Secondary | ICD-10-CM | POA: Diagnosis not present

## 2023-08-05 DIAGNOSIS — R0602 Shortness of breath: Secondary | ICD-10-CM | POA: Diagnosis not present

## 2023-08-05 DIAGNOSIS — I4891 Unspecified atrial fibrillation: Secondary | ICD-10-CM | POA: Diagnosis not present

## 2023-08-05 DIAGNOSIS — I10 Essential (primary) hypertension: Secondary | ICD-10-CM | POA: Diagnosis not present

## 2023-11-03 DIAGNOSIS — M810 Age-related osteoporosis without current pathological fracture: Secondary | ICD-10-CM | POA: Diagnosis not present

## 2023-11-03 DIAGNOSIS — K219 Gastro-esophageal reflux disease without esophagitis: Secondary | ICD-10-CM | POA: Diagnosis not present

## 2023-11-03 DIAGNOSIS — G2581 Restless legs syndrome: Secondary | ICD-10-CM | POA: Diagnosis not present

## 2023-11-03 DIAGNOSIS — R7309 Other abnormal glucose: Secondary | ICD-10-CM | POA: Diagnosis not present

## 2023-11-03 DIAGNOSIS — I10 Essential (primary) hypertension: Secondary | ICD-10-CM | POA: Diagnosis not present

## 2023-11-03 DIAGNOSIS — D649 Anemia, unspecified: Secondary | ICD-10-CM | POA: Diagnosis not present

## 2023-11-03 DIAGNOSIS — F325 Major depressive disorder, single episode, in full remission: Secondary | ICD-10-CM | POA: Diagnosis not present

## 2023-11-10 DIAGNOSIS — D649 Anemia, unspecified: Secondary | ICD-10-CM | POA: Diagnosis not present

## 2023-11-10 DIAGNOSIS — I10 Essential (primary) hypertension: Secondary | ICD-10-CM | POA: Diagnosis not present

## 2023-11-10 DIAGNOSIS — F325 Major depressive disorder, single episode, in full remission: Secondary | ICD-10-CM | POA: Diagnosis not present

## 2023-11-10 DIAGNOSIS — R7309 Other abnormal glucose: Secondary | ICD-10-CM | POA: Diagnosis not present

## 2023-11-10 DIAGNOSIS — I482 Chronic atrial fibrillation, unspecified: Secondary | ICD-10-CM | POA: Diagnosis not present

## 2023-11-10 DIAGNOSIS — G2581 Restless legs syndrome: Secondary | ICD-10-CM | POA: Diagnosis not present

## 2023-11-23 DIAGNOSIS — H16223 Keratoconjunctivitis sicca, not specified as Sjogren's, bilateral: Secondary | ICD-10-CM | POA: Diagnosis not present

## 2023-11-23 DIAGNOSIS — Z961 Presence of intraocular lens: Secondary | ICD-10-CM | POA: Diagnosis not present

## 2024-02-10 DIAGNOSIS — I4891 Unspecified atrial fibrillation: Secondary | ICD-10-CM | POA: Diagnosis not present

## 2024-02-10 DIAGNOSIS — D649 Anemia, unspecified: Secondary | ICD-10-CM | POA: Diagnosis not present

## 2024-02-10 DIAGNOSIS — R0602 Shortness of breath: Secondary | ICD-10-CM | POA: Diagnosis not present

## 2024-02-10 DIAGNOSIS — F32A Depression, unspecified: Secondary | ICD-10-CM | POA: Diagnosis not present

## 2024-02-10 DIAGNOSIS — F419 Anxiety disorder, unspecified: Secondary | ICD-10-CM | POA: Diagnosis not present

## 2024-02-10 DIAGNOSIS — I10 Essential (primary) hypertension: Secondary | ICD-10-CM | POA: Diagnosis not present

## 2024-02-10 DIAGNOSIS — E7800 Pure hypercholesterolemia, unspecified: Secondary | ICD-10-CM | POA: Diagnosis not present

## 2024-02-10 DIAGNOSIS — R011 Cardiac murmur, unspecified: Secondary | ICD-10-CM | POA: Diagnosis not present

## 2024-02-10 DIAGNOSIS — G4733 Obstructive sleep apnea (adult) (pediatric): Secondary | ICD-10-CM | POA: Diagnosis not present

## 2024-02-12 DIAGNOSIS — B9689 Other specified bacterial agents as the cause of diseases classified elsewhere: Secondary | ICD-10-CM | POA: Diagnosis not present

## 2024-02-12 DIAGNOSIS — J029 Acute pharyngitis, unspecified: Secondary | ICD-10-CM | POA: Diagnosis not present

## 2024-02-12 DIAGNOSIS — J019 Acute sinusitis, unspecified: Secondary | ICD-10-CM | POA: Diagnosis not present
